# Patient Record
Sex: Male | Born: 1970 | Race: Black or African American | Hispanic: No | Marital: Single | Smoking: Former smoker
Health system: Southern US, Community
[De-identification: ages and names within clinical notes are randomized; demographics above are authoritative.]

---

## 2009-12-18 ENCOUNTER — Encounter: Admission: RE | Admit: 2009-12-18 | Discharge: 2009-12-18 | Payer: Self-pay | Admitting: Podiatry

## 2015-07-12 ENCOUNTER — Encounter (HOSPITAL_COMMUNITY): Payer: Self-pay

## 2015-07-12 ENCOUNTER — Emergency Department (HOSPITAL_COMMUNITY)
Admission: EM | Admit: 2015-07-12 | Discharge: 2015-07-12 | Disposition: A | Discharge status: Home / Self Care | Payer: BLUE CROSS/BLUE SHIELD | Attending: Emergency Medicine | Admitting: Emergency Medicine

## 2015-07-12 ENCOUNTER — Emergency Department (HOSPITAL_COMMUNITY): Payer: BLUE CROSS/BLUE SHIELD

## 2015-07-12 DIAGNOSIS — Y999 Unspecified external cause status: Secondary | ICD-10-CM | POA: Insufficient documentation

## 2015-07-12 DIAGNOSIS — S5292XA Unspecified fracture of left forearm, initial encounter for closed fracture: Secondary | ICD-10-CM

## 2015-07-12 DIAGNOSIS — S52252A Displaced comminuted fracture of shaft of ulna, left arm, initial encounter for closed fracture: Secondary | ICD-10-CM | POA: Diagnosis not present

## 2015-07-12 DIAGNOSIS — Y939 Activity, unspecified: Secondary | ICD-10-CM | POA: Diagnosis not present

## 2015-07-12 DIAGNOSIS — Y92410 Unspecified street and highway as the place of occurrence of the external cause: Secondary | ICD-10-CM | POA: Insufficient documentation

## 2015-07-12 DIAGNOSIS — M79632 Pain in left forearm: Secondary | ICD-10-CM | POA: Diagnosis present

## 2015-07-12 MED ORDER — HYDROCODONE-ACETAMINOPHEN 5-325 MG PO TABS: 1.0000 | ORAL_TABLET | ORAL | Status: DC | PRN

## 2015-07-12 MED ORDER — ACETAMINOPHEN 500 MG PO TABS
1000.0000 mg | ORAL_TABLET | Freq: Once | ORAL | Status: AC
  Administered 2015-07-12: 1000 mg via ORAL
  Filled 2015-07-12: qty 2
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
## 2015-07-12 NOTE — Discharge Instructions (Signed)
Forearm Fracture A forearm fracture is a break in one or both of the bones of your arm that are between the elbow and the wrist. Your forearm is made up of two bones:  Radius. This is the bone on the inside of your arm near your thumb.  Ulna. This is the bone on the outside of your arm near your little finger. Middle forearm fractures usually break both the radius and the ulna. Most forearm fractures that involve both the ulna and radius will require surgery. CAUSES Common causes of this type of fracture include:  Falling on an outstretched arm.  Accidents, such as a car or bike accident.  A hard, direct hit to the middle part of your arm. RISK FACTORS You may be at higher risk for this type of fracture if:  You play contact sports.  You have a condition that causes your bones to be weak or thin (osteoporosis). SIGNS AND SYMPTOMS A forearm fracture causes pain immediately after the injury. Other signs and symptoms include:  An abnormal bend or bump in your arm (deformity).  Swelling.  Numbness or tingling.  Tenderness.  Inability to turn your hand from side to side (rotate).  Bruising. DIAGNOSIS Your health care provider may diagnose a forearm fracture based on:  Your symptoms.  Your medical history, including any recent injury.  A physical exam. Your health care provider will look for any deformity and feel for tenderness over the break. Your health care provider will also check whether the bones are out of place.  An X-ray exam to confirm the diagnosis and learn more about the type of fracture. TREATMENT The goals of treatment are to get the bone or bones in proper position for healing and to keep the bones from moving so they will heal over time. Your treatment will depend on many factors, especially the type of fracture that you have.  If the fractured bone or bones:  Are in the correct position (nondisplaced), you may only need to wear a cast or a  splint.  Have a slightly displaced fracture, you may need to have the bones moved back into place manually (closed reduction) before the splint or cast is put on.  You may have a temporary splint before you have a cast. The splint allows room for some swelling. After a few days, a cast can replace the splint.  You may have to wear the cast for 6-8 weeks or as directed by your health care provider.  The cast may be changed after about 3 weeks or as directed by your health care provider.  After your cast is removed, you may need physical therapy to regain full movement in your wrist or elbow.  You may need emergency surgery if you have:  A fractured bone or bones that are out of position (displaced).  A fracture with multiple fragments (comminuted fracture).  A fracture that breaks the skin (open fracture). This type of fracture may require surgical wires, plates, or screws to hold the bone or bones in place.  You may have X-rays every couple of weeks to check on your healing. HOME CARE INSTRUCTIONS If You Have a Cast:  Do not stick anything inside the cast to scratch your skin. Doing that increases your risk of infection.  Check the skin around the cast every day. Report any concerns to your health care provider. You may put lotion on dry skin around the edges of the cast. Do not apply lotion to the skin  underneath the cast. If You Have a Splint:  Wear it as directed by your health care provider. Remove it only as directed by your health care provider.  Loosen the splint if your fingers become numb and tingle, or if they turn cold and blue. Bathing  Cover the cast or splint with a watertight plastic bag to protect it from water while you bathe or shower. Do not let the cast or splint get wet. Managing Pain, Stiffness, and Swelling  If directed, apply ice to the injured area:  Put ice in a plastic bag.  Place a towel between your skin and the bag.  Leave the ice on for 20  minutes, 2-3 times a day.  Move your fingers often to avoid stiffness and to lessen swelling.  Raise the injured area above the level of your heart while you are sitting or lying down. Driving  Do not drive or operate heavy machinery while taking pain medicine.  Do not drive while wearing a cast or splint on a hand that you use for driving. Activity  Return to your normal activities as directed by your health care provider. Ask your health care provider what activities are safe for you.  Perform range-of-motion exercises only as directed by your health care provider. Safety  Do not use your injured limb to support your body weight until your health care provider says that you can. General Instructions  Do not put pressure on any part of the cast or splint until it is fully hardened. This may take several hours.  Keep the cast or splint clean and dry.  Do not use any tobacco products, including cigarettes, chewing tobacco, or electronic cigarettes. Tobacco can delay bone healing. If you need help quitting, ask your health care provider.  Take medicines only as directed by your health care provider.  Keep all follow-up visits as directed by your health care provider. This is important. SEEK MEDICAL CARE IF:  Your pain medicine is not helping.  Your cast or splint becomes wet or damaged or suddenly feels too tight.  Your cast becomes loose.  You have more severe pain or swelling than you did before the cast.  You have severe pain when you stretch your fingers.  You continue to have pain or stiffness in your elbow or your wrist after your cast is removed. SEEK IMMEDIATE MEDICAL CARE IF:  You cannot move your fingers.  You lose feeling in your fingers or your hand.  Your hand or your fingers turn cold and pale or blue.  You notice a bad smell coming from your cast.  You have drainage from underneath your cast.  You have new stains from blood or drainage that is coming  through your cast.   This information is not intended to replace advice given to you by your health care provider. Make sure you discuss any questions you have with your health care provider.   Document Released: 01/08/2000 Document Revised: 01/31/2014 Document Reviewed: 08/26/2013 Elsevier Interactive Patient Education 2016 [place] or Splint Care Casts and splints support injured limbs and keep bones from moving while they heal. It is important to care for your cast or splint at home.  HOME CARE INSTRUCTIONS  Keep the cast or splint uncovered during the drying period. It can take 24 to 48 hours to dry if it is made of plaster. A fiberglass cast will dry in less than 1 hour.  Do not rest the cast on anything harder than  a pillow for the first 24 hours.  Do not put weight on your injured limb or apply pressure to the cast until your health care provider gives you permission.  Keep the cast or splint dry. Wet casts or splints can lose their shape and may not support the limb as well. A wet cast that has lost its shape can also create harmful pressure on your skin when it dries. Also, wet skin can become infected.  Cover the cast or splint with a plastic bag when bathing or when out in the rain or snow. If the cast is on the trunk of the body, take sponge baths until the cast is removed.  If your cast does become wet, dry it with a towel or a blow dryer on the cool setting only.  Keep your cast or splint clean. Soiled casts may be wiped with a moistened cloth.  Do not place any hard or soft foreign objects under your cast or splint, such as cotton, toilet paper, lotion, or powder.  Do not try to scratch the skin under the cast with any object. The object could get stuck inside the cast. Also, scratching could lead to an infection. If itching is a problem, use a blow dryer on a cool setting to relieve discomfort.  Do not trim or cut your cast or remove padding from inside of  it.  Exercise all joints next to the injury that are not immobilized by the cast or splint. For example, if you have a long leg cast, exercise the hip joint and toes. If you have an arm cast or splint, exercise the shoulder, elbow, thumb, and fingers.  Elevate your injured arm or leg on 1 or 2 pillows for the first 1 to 3 days to decrease swelling and pain.It is best if you can comfortably elevate your cast so it is higher than your heart. SEEK MEDICAL CARE IF:   Your cast or splint cracks.  Your cast or splint is too tight or too loose.  You have unbearable itching inside the cast.  Your cast becomes wet or develops a soft spot or area.  You have a bad smell coming from inside your cast.  You get an object stuck under your cast.  Your skin around the cast becomes red or raw.  You have new pain or worsening pain after the cast has been applied. SEEK IMMEDIATE MEDICAL CARE IF:   You have fluid leaking through the cast.  You are unable to move your fingers or toes.  You have discolored (blue or white), cool, painful, or very swollen fingers or toes beyond the cast.  You have tingling or numbness around the injured area.  You have severe pain or pressure under the cast.  You have any difficulty with your breathing or have shortness of breath.  You have chest pain.   This information is not intended to replace advice given to you by your health care provider. Make sure you discuss any questions you have with your health care provider.   Document Released: 01/08/2000 Document Revised: 10/31/2012 Document Reviewed: 07/19/2012 Elsevier Interactive Patient Education 2016 [employer]. How to Use a Sling A sling is a type of hanging bandage that is worn around your neck to protect an injured arm, shoulder, or other body part. You may need to wear a sling to keep you from moving (immobilize) the injured body part while it heals. Keeping the injured part of your body still reduces  pain and  speeds up healing. Your health care provider may recommend using a sling if you have:   A broken arm.  A broken collarbone.  A shoulder injury.  Surgery. RISKS AND COMPLICATIONS Wearing a sling the wrong way can:  Make your injury worse.  Cause stiffness or numbness.  Affect blood circulation in your arm and hand. This can causetingling or numbness in your fingers or hands. HOW TO USE A SLING The way that you should use a sling depends on your injury. It is important that you follow all of your health care provider's instructions for your injury. Also follow these general guidelines:  Wear the sling so that your arm bends 90 degrees at the elbow. That is like a right angle or the shape of a capital letter "L." The sling should also support your wrist and your hand.  Try to avoid moving your arm.  Do not lie down flat on your back while wearing a sling. Sleep in a recliner or use pillows to raise your upper body in bed.  Do not twist, raise, or move your arm in a way that could make your injury worse.  Do not lean on your arm while wearing a sling.  Do not lift anything while wearing a sling. SEEK MEDICAL CARE IF:  You have bruising, swelling, or pain that is getting worse.  Your pain medicine is not helping.  You have a fever. SEEK IMMEDIATE MEDICAL CARE IF:  Your fingers are numb or tingling.  Your fingers turn blue or feel cold to the touch.  You cannot control the bleeding from your injury.  You are short of breath.   This information is not intended to replace advice given to you by your health care provider. Make sure you discuss any questions you have with your health care provider.   Document Released: 08/25/2003 Document Revised: 01/31/2014 Document Reviewed: 11/13/2013 Elsevier Interactive Patient Education [employer] Inc[employer]
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
## 2016-07-04 ENCOUNTER — Encounter: Payer: Self-pay | Admitting: Emergency Medicine

## 2016-07-04 ENCOUNTER — Ambulatory Visit (INDEPENDENT_AMBULATORY_CARE_PROVIDER_SITE_OTHER): Payer: BLUE CROSS/BLUE SHIELD | Admitting: Emergency Medicine

## 2016-07-04 VITALS — BP 126/78 | HR 84 | Temp 98.3°F | Resp 16 | Ht 70.25 in | Wt 218.4 lb

## 2016-07-04 DIAGNOSIS — M25552 Pain in left hip: Secondary | ICD-10-CM

## 2016-07-04 MED ORDER — PREDNISONE 20 MG PO TABS: 40.0000 mg | ORAL_TABLET | Freq: Every day | ORAL | 0 refills | Status: AC

## 2016-07-04 MED ORDER — METHYLPREDNISOLONE ACETATE 80 MG/ML IJ SUSP
80.0000 mg | Freq: Once | INTRAMUSCULAR | Status: AC
  Administered 2016-07-04: 80 mg via INTRAMUSCULAR

## 2016-07-04 MED ORDER — DICLOFENAC SODIUM 75 MG PO TBEC
75.0000 mg | DELAYED_RELEASE_TABLET | Freq: Two times a day (BID) | ORAL | 0 refills | Status: AC
Start: 2016-07-04 — End: 2016-07-09

## 2016-07-04 NOTE — Progress Notes (Signed)
Victor Baker 46 y.o.   Chief Complaint  Patient presents with  . New Patient (Initial Visit)    low back pain (uneven hips), wanting cortisone shot, pain x 2 weeks    HISTORY OF PRESENT ILLNESS: This is a 46 y.o. male complaining of pain to left hip x 2 weeks. Denies trauma. Hip Pain   The incident occurred more than 1 week ago. There was no injury mechanism. The pain is present in the left hip. The quality of the pain is described as aching. The pain is at a severity of 5/10. The pain is moderate. The pain has been fluctuating since onset. Pertinent negatives include no inability to bear weight, loss of motion, loss of sensation, muscle weakness, numbness or tingling. The symptoms are aggravated by weight bearing.     Prior to Admission medications   Medication Sig Start Date End Date Taking? Authorizing Provider  HYDROcodone-acetaminophen (NORCO/VICODIN) 5-325 MG tablet Take 1-2 tablets by mouth every 4 (four) hours as needed. 07/12/15   Lorre Nick, MD    No Known Allergies  There are no active problems to display for this patient.   History reviewed. No pertinent past medical history.  History reviewed. No pertinent surgical history.  Social History   Social History  . Marital status: Single    Spouse name: N/A  . Number of children: N/A  . Years of education: N/A   Occupational History  . Not on file.   Social History Main Topics  . Smoking status: Former Smoker    Packs/day: 0.25    Years: 15.00  . Smokeless tobacco: Never Used  . Alcohol use No  . Drug use: No  . Sexual activity: Not on file   Other Topics Concern  . Not on file   Social History Narrative  . No narrative on file    Family History  Problem Relation Age of Onset  . Diabetes Mother   . Heart disease Paternal Grandmother   . Hypertension Paternal Grandmother      Review of Systems  Constitutional: Negative.  Negative for chills and fever.  HENT: Negative.   Eyes: Negative.     Respiratory: Negative for cough and shortness of breath.   Cardiovascular: Negative for chest pain, palpitations and claudication.  Gastrointestinal: Negative for abdominal pain, blood in stool, nausea and vomiting.  Genitourinary: Negative for dysuria and hematuria.  Musculoskeletal: Positive for back pain and joint pain (left hip). Negative for myalgias and neck pain.  Skin: Negative for rash.  Neurological: Negative for dizziness, tingling, sensory change, numbness and headaches.  Endo/Heme/Allergies: Negative.   All other systems reviewed and are negative.   Vitals:   07/04/16 1504 07/04/16 1522  BP: 126/78   Pulse: (!) 138 84  Resp: 16   Temp: 98.3 F (36.8 C)     Physical Exam  Constitutional: He is oriented to person, place, and time. He appears well-developed and well-nourished.  HENT:  Head: Normocephalic and atraumatic.  Nose: Nose normal.  Mouth/Throat: Oropharynx is clear and moist. No oropharyngeal exudate.  Eyes: Conjunctivae and EOM are normal. Pupils are equal, round, and reactive to light.  Neck: Normal range of motion. Neck supple. No JVD present. No thyromegaly present.  Cardiovascular: Normal rate, regular rhythm, normal heart sounds and intact distal pulses.   Repeat HR: 84  Pulmonary/Chest: Effort normal and breath sounds normal.  Abdominal: Soft. Bowel sounds are normal. He exhibits no distension. There is no tenderness. There is no rebound. No hernia.  Musculoskeletal: Normal range of motion.  Left hip: non-tender but c/o pain during ROM  Lymphadenopathy:    He has no cervical adenopathy.  Neurological: He is alert and oriented to person, place, and time. No sensory deficit. He exhibits normal muscle tone.  Skin: Skin is warm and dry. Capillary refill takes less than 2 seconds. No rash noted.  Psychiatric: He has a normal mood and affect. His behavior is normal.  Vitals reviewed.    ASSESSMENT & PLAN: [name] was seen today for new patient  (initial visit).  Diagnoses and all orders for this visit:  Left hip pain -     methylPREDNISolone acetate (DEPO-MEDROL) injection 80 mg; Inject 1 mL (80 mg total) into the muscle once.  Other orders -     diclofenac (VOLTAREN) 75 MG EC tablet; Take 1 tablet (75 mg total) by mouth 2 (two) times daily. -     predniSONE (DELTASONE) 20 MG tablet; Take 2 tablets (40 mg total) by mouth daily with breakfast.    Patient Instructions       IF you received an x-ray today, you will receive an invoice from [facility]. Please contact [facility] at [phone] with questions or concerns regarding your invoice.   IF you received labwork today, you will receive an invoice from [facility]. Please contact [facility] at [phone] with questions or concerns regarding your invoice.   Our billing staff will not be able to assist you with questions regarding bills from these companies.  You will be contacted with the lab results as soon as they are available. The fastest way to get your results is to activate your My Chart account. Instructions are located on the last page of this paperwork. If you have not heard from us regarding the results in 2 weeks, please contact this office.     Hip Bursitis Hip bursitis is swelling of a fluid-filled sac (bursa) in your hip. This swelling (inflammation) can be painful. This condition may come and go over time. Follow these instructions at home: Medicines  Take over-the-counter and prescription medicines only as told by your doctor.  Do not drive or use heavy machinery while taking prescription pain medicine, or as told by your doctor.  If you were prescribed an antibiotic medicine, take it as told by your doctor. Do not stop taking the antibiotic even if you start to feel better. Activity  Return to your normal activities as told by your doctor. Ask your doctor what activities are safe for you.  Rest and protect your hip until you feel  better. General instructions  Wear wraps that put pressure on your hip (compression wraps) only as told by your doctor.  Raise (elevate) your hip above the level of your heart as much as you can. To do this, try putting a pillow under your hips while you lie down. Stop if this causes pain.  Do not use your hip to support your body weight until your doctor says that you can.  Use crutches as told by your doctor.  Gently rub and stretch your injured area as often as is comfortable.  Keep all follow-up visits as told by your doctor. This is important. How is this prevented?  Exercise regularly, as told by your doctor.  Warm up and stretch before being active.  Cool down and stretch after being active.  Avoid activities that bother your hip or cause pain.  Avoid sitting down for long periods at a time. Contact a doctor if:  You have a fever.  You get new symptoms.  You have trouble walking.  You have trouble doing everyday activities.  You have pain that gets worse.  You have pain that does not get better with medicine.  You get red skin on your hip area.  You get a feeling of warmth in your hip area. Get help right away if:  You cannot move your hip.  You have very bad pain. This information is not intended to replace advice given to you by your health care provider. Make sure you discuss any questions you have with your health care provider. Document Released: 02/12/2010 Document Revised: 06/18/2015 Document Reviewed: 08/12/2014 Elsevier Interactive Patient Education  2018 [employer].       Edwina Barth, MD [facility]

## 2016-07-04 NOTE — Patient Instructions (Addendum)
     IF you received an x-ray today, you will receive an invoice from [facility]. Please contact [facility] at [phone] with questions or concerns regarding your invoice.   IF you received labwork today, you will receive an invoice from [facility]. Please contact [facility] at [phone] with questions or concerns regarding your invoice.   Our billing staff will not be able to assist you with questions regarding bills from these companies.  You will be contacted with the lab results as soon as they are available. The fastest way to get your results is to activate your My Chart account. Instructions are located on the last page of this paperwork. If you have not heard from us regarding the results in 2 weeks, please contact this office.     Hip Bursitis Hip bursitis is swelling of a fluid-filled sac (bursa) in your hip. This swelling (inflammation) can be painful. This condition may come and go over time. Follow these instructions at home: Medicines  Take over-the-counter and prescription medicines only as told by your doctor.  Do not drive or use heavy machinery while taking prescription pain medicine, or as told by your doctor.  If you were prescribed an antibiotic medicine, take it as told by your doctor. Do not stop taking the antibiotic even if you start to feel better. Activity  Return to your normal activities as told by your doctor. Ask your doctor what activities are safe for you.  Rest and protect your hip until you feel better. General instructions  Wear wraps that put pressure on your hip (compression wraps) only as told by your doctor.  Raise (elevate) your hip above the level of your heart as much as you can. To do this, try putting a pillow under your hips while you lie down. Stop if this causes pain.  Do not use your hip to support your body weight until your doctor says that you can.  Use crutches as told by your doctor.  Gently rub and  stretch your injured area as often as is comfortable.  Keep all follow-up visits as told by your doctor. This is important. How is this prevented?  Exercise regularly, as told by your doctor.  Warm up and stretch before being active.  Cool down and stretch after being active.  Avoid activities that bother your hip or cause pain.  Avoid sitting down for long periods at a time. Contact a doctor if:  You have a fever.  You get new symptoms.  You have trouble walking.  You have trouble doing everyday activities.  You have pain that gets worse.  You have pain that does not get better with medicine.  You get red skin on your hip area.  You get a feeling of warmth in your hip area. Get help right away if:  You cannot move your hip.  You have very bad pain. This information is not intended to replace advice given to you by your health care provider. Make sure you discuss any questions you have with your health care provider. Document Released: 02/12/2010 Document Revised: 06/18/2015 Document Reviewed: 08/12/2014 Elsevier Interactive Patient Education  [employer]2018 [employer] Inc.

## 2016-07-06 ENCOUNTER — Telehealth: Payer: Self-pay | Admitting: Family Medicine

## 2016-07-06 NOTE — Telephone Encounter (Signed)
Dr Alvy BimlerSagardia pt calling stating that he is still having pain in his hip he worked 10 hours yesterday and the hip is still bothering him his pain is a 4 out of 10 best to call him on cell [phone]

## 2016-07-09 NOTE — Telephone Encounter (Signed)
Dr. Alvy BimlerSagardia, please see the message. The patient need to talk to you. Tanks.

## 2016-07-11 NOTE — Telephone Encounter (Signed)
Called but no answer.

## 2016-07-15 ENCOUNTER — Ambulatory Visit: Payer: BLUE CROSS/BLUE SHIELD | Admitting: Emergency Medicine

## 2016-07-16 ENCOUNTER — Ambulatory Visit (INDEPENDENT_AMBULATORY_CARE_PROVIDER_SITE_OTHER): Payer: BLUE CROSS/BLUE SHIELD | Admitting: Family Medicine

## 2016-07-16 ENCOUNTER — Encounter: Payer: Self-pay | Admitting: Family Medicine

## 2016-07-16 VITALS — BP 140/92 | HR 100 | Temp 98.7°F | Resp 18 | Ht 70.25 in | Wt 220.0 lb

## 2016-07-16 DIAGNOSIS — M7072 Other bursitis of hip, left hip: Secondary | ICD-10-CM | POA: Diagnosis not present

## 2016-07-16 DIAGNOSIS — M25552 Pain in left hip: Secondary | ICD-10-CM

## 2016-07-16 MED ORDER — MELOXICAM 15 MG PO TABS: 15.0000 mg | ORAL_TABLET | Freq: Every day | ORAL | 2 refills | Status: AC

## 2016-07-16 NOTE — Patient Instructions (Addendum)
Antiinflammatories Steroids- prednisone  Non steroidals  alleve advil Motrin Ibuprofen Naproxen Diclofenac meloxicam  You cannot combine the non steroidal anti-inflammatories This will cause a GI bleed  You can take tylenol arthritis with your choice of NSAID from above  Follow up with Orthopedics to discussed intraarticular joint injection and if this will be beneficial.    IF you received an x-ray today, you will receive an invoice from [facility]. Please contact [facility] at [phone] with questions or concerns regarding your invoice.   IF you received labwork today, you will receive an invoice from [facility]. Please contact [facility] at [phone] with questions or concerns regarding your invoice.   Our billing staff will not be able to assist you with questions regarding bills from these companies.  You will be contacted with the lab results as soon as they are available. The fastest way to get your results is to activate your My Chart account. Instructions are located on the last page of this paperwork. If you have not heard from [place] regarding the results in 2 weeks, please contact this office.     Hip Bursitis Hip bursitis is inflammation of a fluid-filled sac (bursa) in the hip joint. The bursa protects the bones in the hip joint from rubbing against each other. Hip bursitis can cause mild to moderate pain, and symptoms often come and go over time. What are the causes? This condition may be caused by:  Injury to the hip.  Overuse of the muscles that surround the hip joint.  Arthritis or gout.  Diabetes.  Thyroid disease.  Cold weather.  Infection.  In some cases, the cause may not be known. What are the signs or symptoms? Symptoms of this condition may include:  Mild or moderate pain in the hip area. Pain may get worse with movement.  Tenderness and swelling of the hip, especially on the outer side of the hip.  Symptoms may  come and go. If the bursa becomes infected, you may have the following symptoms:  Fever.  Red skin and a feeling of warmth in the hip area.  How is this diagnosed? This condition may be diagnosed based on:  A physical exam.  Your medical history.  X-rays.  Removal of fluid from your inflamed bursa for testing (biopsy).  You may be sent to a health care provider who specializes in bone diseases (orthopedist) or a provider who specializes in joint inflammation (rheumatologist). How is this treated? This condition is treated by resting, raising (elevating), and applying pressure(compression) to the injured area. In some cases, this may be enough to make your symptoms go away. Treatment may also include:  Crutches.  Antibiotic medicine.  Draining fluid out of the bursa to help relieve swelling.  Injecting medicine that helps to reduce inflammation (cortisone).  Follow these instructions at home: Medicines  Take over-the-counter and prescription medicines only as told by your health care provider.  Do not drive or operate heavy machinery while taking prescription pain medicine, or as told by your health care provider.  If you were prescribed an antibiotic, take it as told by your health care provider. Do not stop taking the antibiotic even if you start to feel better. Activity  Return to your normal activities as told by your health care provider. Ask your health care provider what activities are safe for you.  Rest and protect your hip as much as possible until your pain and swelling get better. General instructions  Wear compression wraps only as  told by your health care provider.  Elevate your hip above the level of your heart as much as you can without pain. To do this, try putting a pillow under your hips while you lie down.  Do not use your hip to support your body weight until your health care provider says that you can. Use crutches as told by your health care  provider.  Gently massage and stretch your injured area as often as is comfortable.  Keep all follow-up visits as told by your health care provider. This is important. How is this prevented?  Exercise regularly, as told by your health care provider.  Warm up and stretch before being active.  Cool down and stretch after being active.  If an activity irritates your hip or causes pain, avoid the activity as much as possible.  Avoid sitting down for long periods at a time. Contact a health care provider if:  You have a fever.  You develop new symptoms.  You have difficulty walking or doing everyday activities.  You have pain that gets worse or does not get better with medicine.  You develop red skin or a feeling of warmth in your hip area. Get help right away if:  You cannot move your hip.  You have severe pain. This information is not intended to replace advice given to you by your health care provider. Make sure you discuss any questions you have with your health care provider. Document Released: 07/02/2001 Document Revised: 06/18/2015 Document Reviewed: 08/12/2014 Elsevier Interactive Patient Education  [employer]2018 Elsevier Inc.

## 2016-07-16 NOTE — Progress Notes (Signed)
Chief Complaint  Patient presents with  . Follow-up    Bursitis, left hip. 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  Current Outpatient Prescriptions  Medication Sig Dispense Refill  . amoxicillin-clavulanate (AUGMENTIN) 875-125 MG tablet amoxicillin 875 mg-potassium clavulanate 125 mg tablet  Take 1 tablet every 12 hours by oral route for 14 days.    Marland Kitchen doxycycline (VIBRAMYCIN) 100 MG capsule doxycycline hyclate 100 mg capsule  TAKE 1 CAPSULE(S) TWICE A DAY BY ORAL ROUTE AS DIRECTED FOR 10 DAYS.    Marland Kitchen meloxicam (MOBIC) 15 MG tablet Take 1 tablet (15 mg total) by mouth daily. 30 tablet 2   No current facility-administered medications for this visit.     Allergies: No Known Allergies  No past surgical history on file.  Social History   Social History  . Marital status: Single    Spouse name: N/A  . Number of children: N/A  . Years of education: N/A   Social History Main Topics  . Smoking status: Former Smoker    Packs/day: 0.25    Years: 15.00  . Smokeless tobacco: Never Used  . Alcohol use No  . Drug use: No  . Sexual activity: Not Asked   Other Topics Concern  . None   Social History Narrative  . None    ROS  Review of Systems See HPI Constitution: No fevers or chills No malaise No diaphoresis Skin: No rash or itching Eyes: no blurry vision, no double vision GU: no dysuria or hematuria Neuro: no dizziness or headaches  Objective: Vitals:   07/16/16 1502  BP: (!) 140/92  Pulse: 100  Resp: 18  Temp: 98.7 F (37.1 C)  TempSrc: Oral  SpO2:  98%  Weight: 220 lb (99.8 kg)  Height: 5' 10.25" (1.784 m)    Physical Exam  Constitutional: He appears well-developed and well-nourished.  HENT:  Head: Normocephalic and atraumatic.  Eyes: Conjunctivae and EOM are normal.  Musculoskeletal:       Right hip: Normal. He exhibits normal range of motion, normal strength, no tenderness, no bony tenderness and no swelling.       Left hip: He exhibits normal range of motion, normal strength, no swelling and no crepitus.       Legs:   Assessment and Plan Rosemary was seen today for follow-up.  Diagnoses and all orders for this visit:  Left hip pain -     meloxicam (MOBIC) 15 MG tablet; Take 1 tablet (15 mg total) by mouth daily. -     Ambulatory referral to Orthopedic Surgery  Bursitis of left hip, unspecified bursa -     Ambulatory referral to Orthopedic Surgery  Discussed that he should see orthpedics sto discuss intraarticular steroid injection and US of the hip to see if there is bursitis or tendinopathy Discussed different classes of pain meds and explained steroids vs. nsaids and discussed that he should not combine 2 different nsaids due to the risk of GI bleed  A total of 20 minutes were spent face-to-face with the patient during this encounter and over half of that time was spent on counseling and coordination of care.  Ailyne Pawley A Maiyah Goyne

## 2016-08-02 ENCOUNTER — Ambulatory Visit (INDEPENDENT_AMBULATORY_CARE_PROVIDER_SITE_OTHER): Payer: BLUE CROSS/BLUE SHIELD | Admitting: Orthopaedic Surgery

## 2016-08-02 ENCOUNTER — Encounter (INDEPENDENT_AMBULATORY_CARE_PROVIDER_SITE_OTHER): Payer: Self-pay | Admitting: Orthopaedic Surgery

## 2016-08-02 ENCOUNTER — Ambulatory Visit (INDEPENDENT_AMBULATORY_CARE_PROVIDER_SITE_OTHER): Payer: Self-pay

## 2016-08-02 DIAGNOSIS — M25552 Pain in left hip: Secondary | ICD-10-CM | POA: Diagnosis not present

## 2016-08-02 MED ORDER — LIDOCAINE HCL 1 % IJ SOLN
3.0000 mL | INTRAMUSCULAR | Status: AC | PRN
  Administered 2016-08-02: 3 mL

## 2016-08-02 MED ORDER — METHYLPREDNISOLONE ACETATE 40 MG/ML IJ SUSP
40.0000 mg | INTRAMUSCULAR | Status: AC | PRN
  Administered 2016-08-02: 40 mg via INTRA_ARTICULAR

## 2016-08-02 MED ORDER — BUPIVACAINE HCL 0.5 % IJ SOLN
3.0000 mL | INTRAMUSCULAR | Status: AC | PRN
  Administered 2016-08-02: 3 mL via INTRA_ARTICULAR

## 2016-08-02 NOTE — Progress Notes (Signed)
Office Visit Note   Patient: Victor Baker           Date of Birth: 02/11/1970           MRN: [id number] Visit Date: 08/02/2016              Requested by: Doristine BosworthStallings, Zoe A, MD [address] [place], [place] [address] PCP: Georgina QuintSagardia, Miguel Jose, MD   Assessment & Plan: Visit Diagnoses:  1. Pain in left hip     Plan: Overall impression is left trochanteric bursitis. Trochanteric Bursal injection performed today. Follow-up with me as needed.  Follow-Up Instructions: Return if symptoms worsen or fail to improve.   Orders:  Orders Placed This Encounter  Procedures  . XR HIP UNILAT W OR W/O PELVIS 2-3 VIEWS LEFT   No orders of the defined types were placed in this encounter.     Procedures: Large Joint Inj Date/Time: 08/02/2016 4:41 PM Performed by: Tarry KosXU, Kiva Norland M Authorized by: Tarry KosXU, Aailyah Dunbar M   Consent Given by:  Patient Timeout: prior to procedure the correct patient, procedure, and site was verified   Indications:  Pain Location:  Hip Site:  L greater trochanter Prep: patient was prepped and draped in usual sterile fashion   Needle Size:  22 G Approach:  Lateral Ultrasound Guidance: No   Fluoroscopic Guidance: No   Arthrogram: No   Medications:  3 mL lidocaine 1 %; 3 mL bupivacaine 0.5 %; 40 mg methylPREDNISolone acetate 40 MG/ML     Clinical Data: No additional findings.   Subjective: Chief Complaint  Patient presents with  . Left Hip - Pain    Patient is a 46 year old gentleman with left hip pain for several months that radiates from the lateral hip down into the groin. He has seen multiple doctors and a chiropractor for this. He feels like the hip was to give out. He has tried prednisone, meloxicam, Tylenol with partial relief. He denies any radiation of pain down the leg. Denies any numbness or tingling.    Review of Systems  Constitutional: Negative.   All other systems reviewed and are negative.    Objective: Vital Signs: There were no vitals  taken for this visit.  Physical Exam  Constitutional: He is oriented to person, place, and time. He appears well-developed and well-nourished.  HENT:  Head: Normocephalic and atraumatic.  Eyes: Pupils are equal, round, and reactive to light.  Neck: Neck supple.  Pulmonary/Chest: Effort normal.  Abdominal: Soft.  Musculoskeletal: Normal range of motion.  Neurological: He is alert and oriented to person, place, and time.  Skin: Skin is warm.  Psychiatric: He has a normal mood and affect. His behavior is normal. Judgment and thought content normal.  Nursing note and vitals reviewed.   Ortho Exam Left hip exam shows painless range of motion the hip. Negative Stinchfield sign. Lateral hip is tender. No sciatic tension signs. Specialty Comments:  No specialty comments available.  Imaging: Xr Hip Unilat W Or W/o Pelvis 2-3 Views Left  Result Date: 08/02/2016 No acute or structural findings no evidence of degenerative arthritis    PMFS History: Patient Active Problem List   Diagnosis Date Noted  . Left hip pain 07/04/2016   No past medical history on file.  Family History  Problem Relation Age of Onset  . Diabetes Mother   . Heart disease Paternal Grandmother   . Hypertension Paternal Grandmother     No past surgical history on file. Social History   Occupational History  .  Not on file.   Social History Main Topics  . Smoking status: Former Smoker    Packs/day: 0.25    Years: 15.00  . Smokeless tobacco: Never Used  . Alcohol use No  . Drug use: No  . Sexual activity: Not on file
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
## 2020-01-29 ENCOUNTER — Encounter (HOSPITAL_COMMUNITY): Payer: Self-pay | Admitting: Emergency Medicine

## 2020-01-29 ENCOUNTER — Ambulatory Visit (HOSPITAL_COMMUNITY)
Admission: EM | Admit: 2020-01-29 | Discharge: 2020-01-29 | Disposition: A | Discharge status: Home / Self Care | Payer: BC Managed Care – PPO | Attending: Family Medicine | Admitting: Family Medicine

## 2020-01-29 DIAGNOSIS — B9789 Other viral agents as the cause of diseases classified elsewhere: Secondary | ICD-10-CM

## 2020-01-29 DIAGNOSIS — J329 Chronic sinusitis, unspecified: Secondary | ICD-10-CM

## 2020-01-29 MED ORDER — PREDNISONE 20 MG PO TABS: 40.0000 mg | ORAL_TABLET | Freq: Every day | ORAL | 0 refills | Status: AC

## 2020-01-29 NOTE — ED Triage Notes (Signed)
Pt states that he has nasal congestion and  watery eyes. Pt states sx started a couple days ago. Pt states that he took benadryl and he helps a little but not much

## 2020-01-29 NOTE — ED Provider Notes (Signed)
[facility]    CSN: [id number] Arrival date & time: 01/29/20  1340      History   Chief Complaint Chief Complaint  Patient presents with  . Nasal Congestion  . Eye Drainage    HPI Victor Baker is a 50 y.o. male.   Here today with watery eyes and sinus congestion the past 2-3 days now. Denies fever, sinus pain or pressure, headache, cough, CP, SOB, N/V/D, body aches, sick contacts. Taking benadryl with mild relief. States frequent hx of sinus infections that feel like this. No known hx of allergic rhinitis.      History reviewed. No pertinent past medical history.  Patient Active Problem List   Diagnosis Date Noted  . Left hip pain 07/04/2016    History reviewed. No pertinent surgical history.     Home Medications    Prior to Admission medications   Medication Sig Start Date End Date Taking? Authorizing Provider  predniSONE (DELTASONE) 20 MG tablet Take 2 tablets (40 mg total) by mouth daily with breakfast. 01/29/20  Yes Particia Nearing, PA-C  amoxicillin-clavulanate (AUGMENTIN) 875-125 MG tablet amoxicillin 875 mg-potassium clavulanate 125 mg tablet  Take 1 tablet every 12 hours by oral route for 14 days.    [provider]  doxycycline (VIBRAMYCIN) 100 MG capsule doxycycline hyclate 100 mg capsule  TAKE 1 CAPSULE(S) TWICE A DAY BY ORAL ROUTE AS DIRECTED FOR 10 DAYS.    [provider]  meloxicam (MOBIC) 15 MG tablet Take 1 tablet (15 mg total) by mouth daily. 07/16/16   Doristine Bosworth, MD    Family History Family History  Problem Relation Age of Onset  . Diabetes Mother   . Heart disease Paternal Grandmother   . Hypertension Paternal Grandmother     Social History Social History   Tobacco Use  . Smoking status: Former Smoker    Packs/day: 0.25    Years: 15.00    Pack years: 3.75  . Smokeless tobacco: Never Used  Substance Use Topics  . Alcohol use: No  . Drug use: No     Allergies   Patient has no known  allergies.   Review of Systems Review of Systems PER HPI   Physical Exam Triage Vital Signs ED Triage Vitals  Enc Vitals Group     BP 01/29/20 1621 (!) 145/94     Pulse Rate 01/29/20 1621 (!) 105     Resp 01/29/20 1621 18     Temp 01/29/20 1621 (!) 97.4 F (36.3 C)     Temp Source 01/29/20 1621 Temporal     SpO2 01/29/20 1621 100 %     Weight --      Height --      Head Circumference --      Peak Flow --      Pain Score 01/29/20 1620 0     Pain Loc --      Pain Edu? --      Excl. in GC? --    No data found.  Updated Vital Signs BP (!) 145/94 (BP Location: Right Arm)   Pulse (!) 105   Temp (!) 97.4 F (36.3 C) (Temporal)   Resp 18   SpO2 100%   Visual Acuity Right Eye Distance:   Left Eye Distance:   Bilateral Distance:    Right Eye Near:   Left Eye Near:    Bilateral Near:     Physical Exam Vitals and nursing note reviewed.  Constitutional:  Appearance: Normal appearance.  HENT:     Head: Atraumatic.     Right Ear: Tympanic membrane normal.     Left Ear: Tympanic membrane normal.     Nose: Rhinorrhea (nasal turbinates boggy and erythematous) present.     Mouth/Throat:     Mouth: Mucous membranes are moist.     Pharynx: Oropharynx is clear. No oropharyngeal exudate.  Eyes:     Extraocular Movements: Extraocular movements intact.     Conjunctiva/sclera: Conjunctivae normal.  Cardiovascular:     Rate and Rhythm: Normal rate and regular rhythm.  Pulmonary:     Effort: Pulmonary effort is normal. No respiratory distress.     Breath sounds: Normal breath sounds. No wheezing or rales.  Abdominal:     General: Bowel sounds are normal. There is no distension.     Palpations: Abdomen is soft.     Tenderness: There is no abdominal tenderness. There is no guarding.  Musculoskeletal:        General: Normal range of motion.     Cervical back: Normal range of motion and neck supple.  Skin:    General: Skin is warm and dry.  Neurological:     General:  No focal deficit present.     Mental Status: He is oriented to person, place, and time.  Psychiatric:        Mood and Affect: Mood normal.        Thought Content: Thought content normal.        Judgment: Judgment normal.      UC Treatments / Results  Labs (all labs ordered are listed, but only abnormal results are displayed) Labs Reviewed - No data to display  EKG   Radiology No results found.  Procedures Procedures (including critical care time)  Medications Ordered in UC Medications - No data to display  Initial Impression / Assessment and Plan / UC Course  I have reviewed the triage vital signs and the nursing notes.  Pertinent labs & imaging results that were available during my care of the patient were reviewed by me and considered in my medical decision making (see chart for details).     Suspect viral vs allergic, overall feeling very well with just nasal congestion at this time. Discussed no indication for abx at this time, will treat with prednisone, mucinex, flonase, antihistamines. Return precautions reviewed.   Final Clinical Impressions(s) / UC Diagnoses   Final diagnoses:  Viral sinusitis   Discharge Instructions   None    ED Prescriptions    Medication Sig Dispense Auth. Provider   predniSONE (DELTASONE) 20 MG tablet Take 2 tablets (40 mg total) by mouth daily with breakfast. 10 tablet Particia Nearing, [place]     PDMP not reviewed this encounter.   Particia Nearing, [place] 01/29/20 1739
